# Patient Record
Sex: Female | Born: 1980
Health system: Southern US, Community
[De-identification: ages and names within clinical notes are randomized; demographics above are authoritative.]

## PROBLEM LIST (undated history)

## (undated) DIAGNOSIS — R110 Nausea: Secondary | ICD-10-CM

## (undated) DIAGNOSIS — F410 Panic disorder [episodic paroxysmal anxiety] without agoraphobia: Secondary | ICD-10-CM

## (undated) DIAGNOSIS — K219 Gastro-esophageal reflux disease without esophagitis: Secondary | ICD-10-CM

## (undated) DIAGNOSIS — G8929 Other chronic pain: Secondary | ICD-10-CM

## (undated) DIAGNOSIS — F329 Major depressive disorder, single episode, unspecified: Secondary | ICD-10-CM

## (undated) DIAGNOSIS — F5101 Primary insomnia: Secondary | ICD-10-CM

## (undated) DIAGNOSIS — R002 Palpitations: Secondary | ICD-10-CM

## (undated) DIAGNOSIS — N6452 Nipple discharge: Secondary | ICD-10-CM

## (undated) DIAGNOSIS — M549 Dorsalgia, unspecified: Secondary | ICD-10-CM

## (undated) DIAGNOSIS — N83202 Unspecified ovarian cyst, left side: Secondary | ICD-10-CM

## (undated) DIAGNOSIS — M79674 Pain in right toe(s): Secondary | ICD-10-CM

## (undated) DIAGNOSIS — G43001 Migraine without aura, not intractable, with status migrainosus: Secondary | ICD-10-CM

## (undated) DIAGNOSIS — F32A Depression, unspecified: Secondary | ICD-10-CM

## (undated) DIAGNOSIS — F419 Anxiety disorder, unspecified: Secondary | ICD-10-CM

## (undated) HISTORY — DX: Primary insomnia: F51.01

## (undated) HISTORY — DX: Other chronic pain: G89.29

## (undated) HISTORY — DX: Unspecified ovarian cyst, left side: N83.202

## (undated) HISTORY — DX: Nausea: R11.0

## (undated) HISTORY — DX: Pain in right toe(s): M79.674

## (undated) HISTORY — DX: Gastro-esophageal reflux disease without esophagitis: K21.9

## (undated) HISTORY — DX: Dorsalgia, unspecified: M54.9

## (undated) HISTORY — DX: Anxiety disorder, unspecified: F41.9

## (undated) HISTORY — DX: Palpitations: R00.2

## (undated) HISTORY — DX: Depression, unspecified: F32.A

## (undated) HISTORY — DX: Migraine without aura, not intractable, with status migrainosus: G43.001

## (undated) HISTORY — DX: Nipple discharge: N64.52

## (undated) HISTORY — DX: Major depressive disorder, single episode, unspecified: F32.9

---
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
## 2007-05-02 HISTORY — PX: ABDOMINAL HYSTERECTOMY: SHX81

## 2014-06-26 ENCOUNTER — Emergency Department (HOSPITAL_COMMUNITY)
Admission: EM | Admit: 2014-06-26 | Discharge: 2014-06-26 | Disposition: A | Discharge status: Home / Self Care | Payer: Medicaid Other | Attending: Emergency Medicine | Admitting: Emergency Medicine

## 2014-06-26 ENCOUNTER — Emergency Department (HOSPITAL_COMMUNITY): Payer: Medicaid Other

## 2014-06-26 ENCOUNTER — Encounter (HOSPITAL_COMMUNITY): Payer: Self-pay | Admitting: *Deleted

## 2014-06-26 DIAGNOSIS — R079 Chest pain, unspecified: Secondary | ICD-10-CM

## 2014-06-26 DIAGNOSIS — Z720 Tobacco use: Secondary | ICD-10-CM | POA: Insufficient documentation

## 2014-06-26 DIAGNOSIS — F419 Anxiety disorder, unspecified: Secondary | ICD-10-CM | POA: Insufficient documentation

## 2014-06-26 DIAGNOSIS — R0789 Other chest pain: Secondary | ICD-10-CM | POA: Diagnosis not present

## 2014-06-26 HISTORY — DX: Panic disorder (episodic paroxysmal anxiety): F41.0

## 2014-06-26 LAB — I-STAT CHEM 8, ED
BUN: 15 mg/dL (ref 6–23)
CALCIUM ION: 1.11 mmol/L — AB (ref 1.12–1.23)
CHLORIDE: 108 mmol/L (ref 96–112)
CREATININE: 0.6 mg/dL (ref 0.50–1.10)
GLUCOSE: 88 mg/dL (ref 70–99)
HCT: 46 % (ref 36.0–46.0)
Hemoglobin: 15.6 g/dL — ABNORMAL HIGH (ref 12.0–15.0)
Potassium: 3.9 mmol/L (ref 3.5–5.1)
Sodium: 142 mmol/L (ref 135–145)
TCO2: 20 mmol/L (ref 0–100)

## 2014-06-26 LAB — I-STAT TROPONIN, ED
TROPONIN I, POC: 0 ng/mL (ref 0.00–0.08)
Troponin i, poc: 0 ng/mL (ref 0.00–0.08)

## 2014-06-26 LAB — D-DIMER, QUANTITATIVE: D-Dimer, Quant: 0.34 ug/mL-FEU (ref 0.00–0.48)

## 2014-06-26 MED ORDER — HYDROCODONE-ACETAMINOPHEN 5-325 MG PO TABS: 2.0000 | ORAL_TABLET | ORAL | Status: DC | PRN

## 2014-06-26 MED ORDER — ACETAMINOPHEN 325 MG PO TABS
650.0000 mg | ORAL_TABLET | Freq: Once | ORAL | Status: AC
  Administered 2014-06-26: 650 mg via ORAL
  Filled 2014-06-26: qty 2

## 2014-06-26 NOTE — ED Notes (Addendum)
Pt reports hx of panic attacks. Onset this am while driving of palpitations, cp and anxiety. HR 90 at triage, ekg done. Reports taking heart medication to control her HR, but recently stopped taking it.

## 2014-06-26 NOTE — ED Provider Notes (Signed)
CSN: [id number]     Arrival date & time 06/26/14  7829 History   First MD Initiated Contact with Patient 06/26/14 [id number]     Chief Complaint  Patient presents with  . Anxiety  . Chest Pain      HPI Patient presents with sudden onset of chest pain followed by an anxiety attack rapid heart rate.  Patient's had this before.  She has no history of cardiac disease.  She got a questionable lesion such is being evaluated on her left breast.  She does have a clear discharge from her left nipple.  No fever chills cough.  No swelling in her legs.  Says her heart rate is better but still having chest pain in her chest feels sore to touch. Past Medical History  Diagnosis Date  . Panic attacks    Past Surgical History  Procedure Laterality Date  . Abdominal hysterectomy     History reviewed. No pertinent family history. History  Substance Use Topics  . Smoking status: Current Every Day Smoker    Types: Cigarettes  . Smokeless tobacco: Not on file  . Alcohol Use: No   OB History    No data available     Review of Systems  All other systems reviewed and are negative  Allergies  Aspirin and Ibuprofen  Home Medications   Prior to Admission medications   Medication Sig Start Date End Date Taking? Authorizing Provider  HYDROcodone-acetaminophen (NORCO/VICODIN) 5-325 MG per tablet Take 2 tablets by mouth every 4 (four) hours as needed. 06/26/14   Nelia Shi, MD   BP 105/62 mmHg  Pulse 51  Temp(Src) 98.2 F (36.8 C) (Oral)  Resp 19  Ht  (1.575 m)  Wt 125 lb (56.7 kg)  BMI 22.86 kg/m2  SpO2 99% Physical Exam  Constitutional: She is oriented to person, place, and time. She appears well-developed and well-nourished. No distress.  HENT:  Head: Normocephalic and atraumatic.  Eyes: Pupils are equal, round, and reactive to light.  Neck: Normal range of motion.  Cardiovascular: Normal rate and intact distal pulses.   Pulmonary/Chest: No respiratory distress.    Pain  reproducible to palpation were indicated  Abdominal: Normal appearance. She exhibits no distension.  Musculoskeletal: Normal range of motion.  Neurological: She is alert and oriented to person, place, and time. No cranial nerve deficit.  Skin: Skin is warm and dry. No rash noted.  Psychiatric: She has a normal mood and affect. Her behavior is normal.  Nursing note and vitals reviewed.   ED Course  Procedures (including critical care time) Labs Review Labs Reviewed  I-STAT CHEM 8, ED - Abnormal; Notable for the following:    Calcium, Ion 1.11 (*)    Hemoglobin 15.6 (*)    All other components within normal limits  D-DIMER, QUANTITATIVE  I-STAT TROPOININ, ED  Rosezena Sensor, ED    Imaging Review Dg Chest 2 View  06/26/2014   CLINICAL DATA:  34 year old female new with 6 month history of left chest pain acutely worse today accompanied by sensation of heart racing.  EXAM: CHEST  2 VIEW  COMPARISON:  None.  FINDINGS: The lungs are clear and negative for focal airspace consolidation, pulmonary edema or suspicious pulmonary nodule. No pleural effusion or pneumothorax. Cardiac and mediastinal contours are within normal limits. No acute fracture or lytic or blastic osseous lesions. The visualized upper abdominal bowel gas pattern is unremarkable. Surgical clips in the right upper quadrant suggest prior cholecystectomy.  IMPRESSION: Normal chest  x-ray.   Electronically Signed   By: Malachy MoanHeath  McCullough M.D.   On: 06/26/2014 10:07     EKG Interpretation   Date/Time:  Friday June 26 2014 08:58:55 EST Ventricular Rate:  72 PR Interval:  134 QRS Duration: 88 QT Interval:  370 QTC Calculation: 405 R Axis:   62 Text Interpretation:  Normal sinus rhythm Normal ECG Confirmed by Eviana Sibilia   MD, Pryce Folts ([id number]) on 06/26/2014 9:27:55 AM       Delta troponins are negative.  Exam the story more consistent with chest wall pain.  Encouraged her to follow-up for evaluation of breast tenderness.   Patient probably could benefit from early outpatient Holter monitor.  Will follow with cardiology. MDM   Final diagnoses:  Chest pain  Chest wall pain        Nelia Shiobert L Karma Ansley, MD 06/26/14 1349

## 2014-06-26 NOTE — ED Notes (Signed)
Pharmacy tech at bedside 

## 2014-06-26 NOTE — Discharge Instructions (Signed)

## 2014-07-23 ENCOUNTER — Ambulatory Visit: Payer: Medicaid Other | Admitting: Cardiology

## 2014-08-26 ENCOUNTER — Ambulatory Visit: Payer: Medicaid Other | Admitting: Cardiology

## 2016-02-21 IMAGING — DX DG CHEST 2V
2 series · 2 of 2 positions shown · non-contrast
Comparison: None.

CLINICAL DATA: 33-year-old female new with 6 month history of left
chest pain acutely worse today accompanied by sensation of heart
racing.

EXAM:
CHEST  2 VIEW

[chest pa]
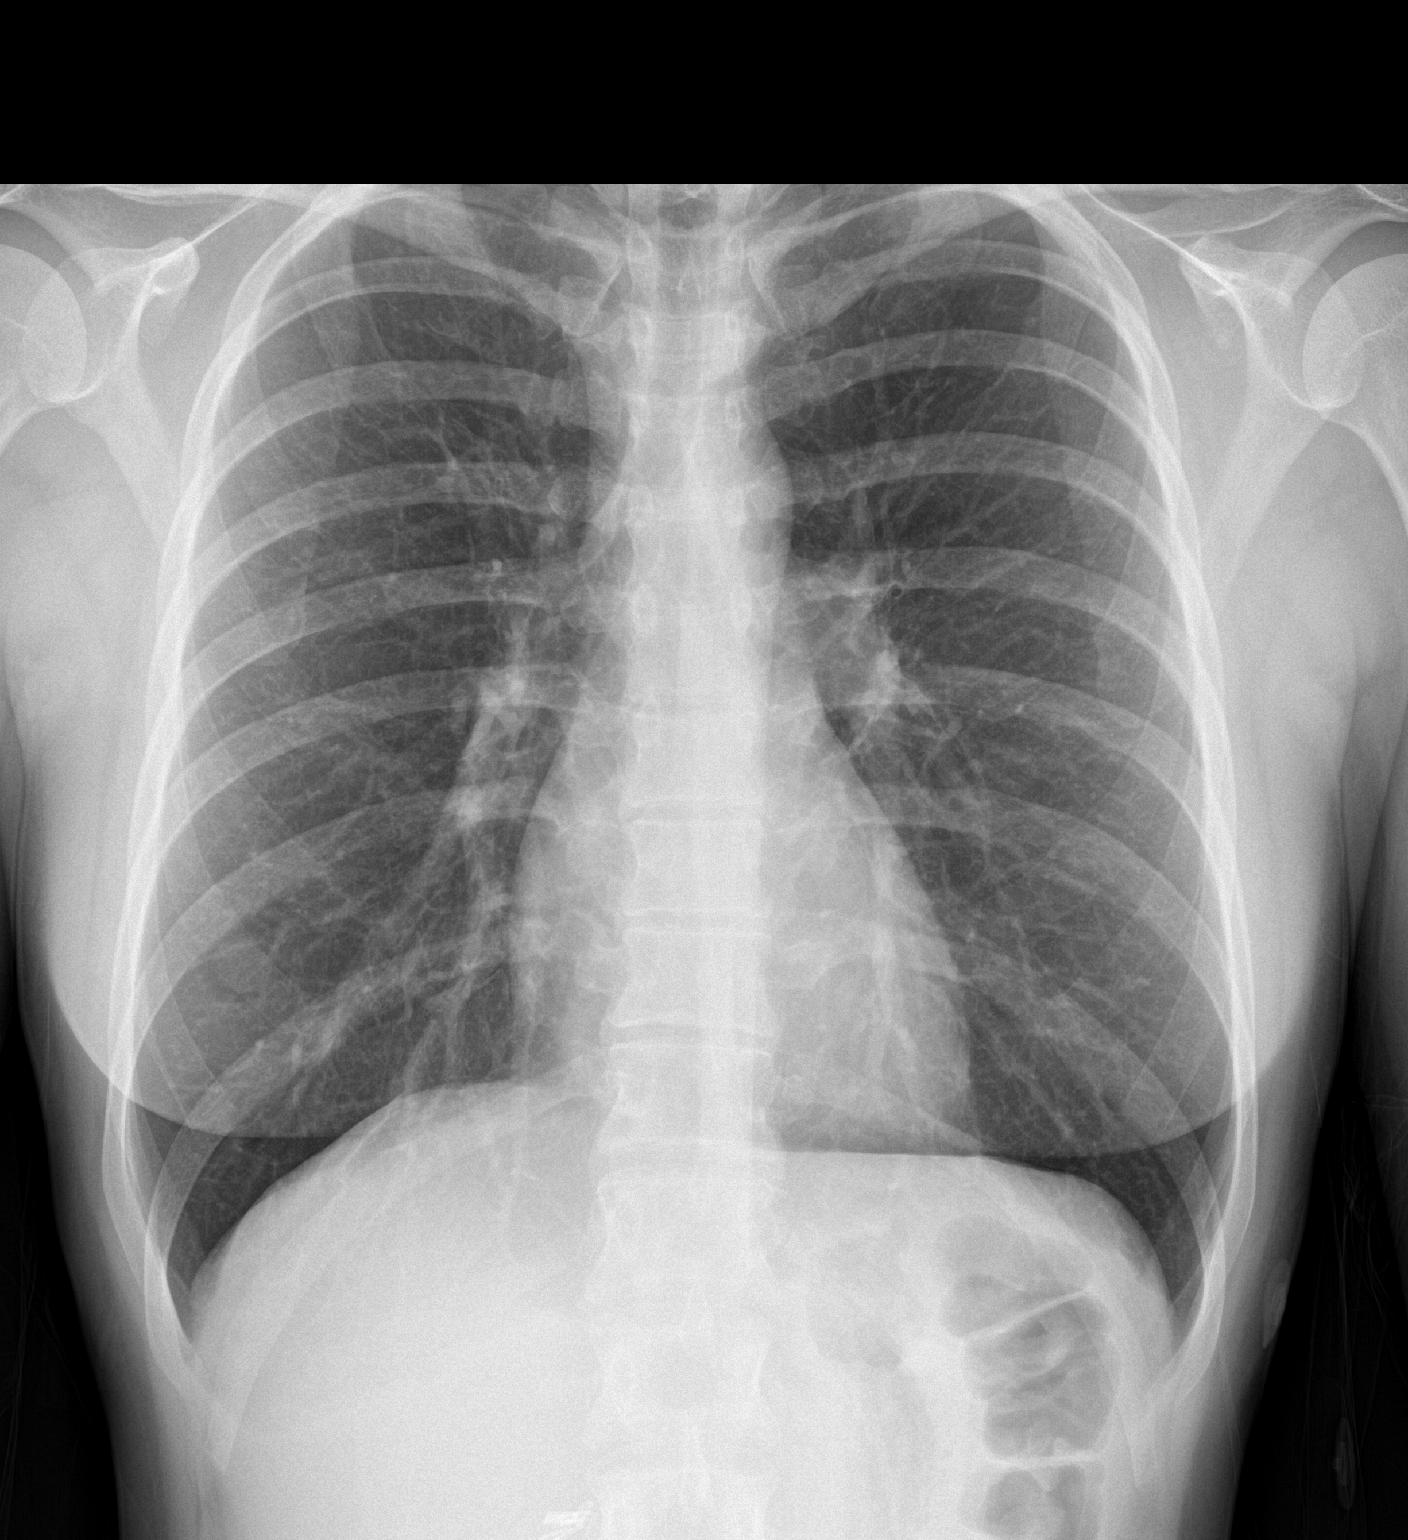

[chest lat]
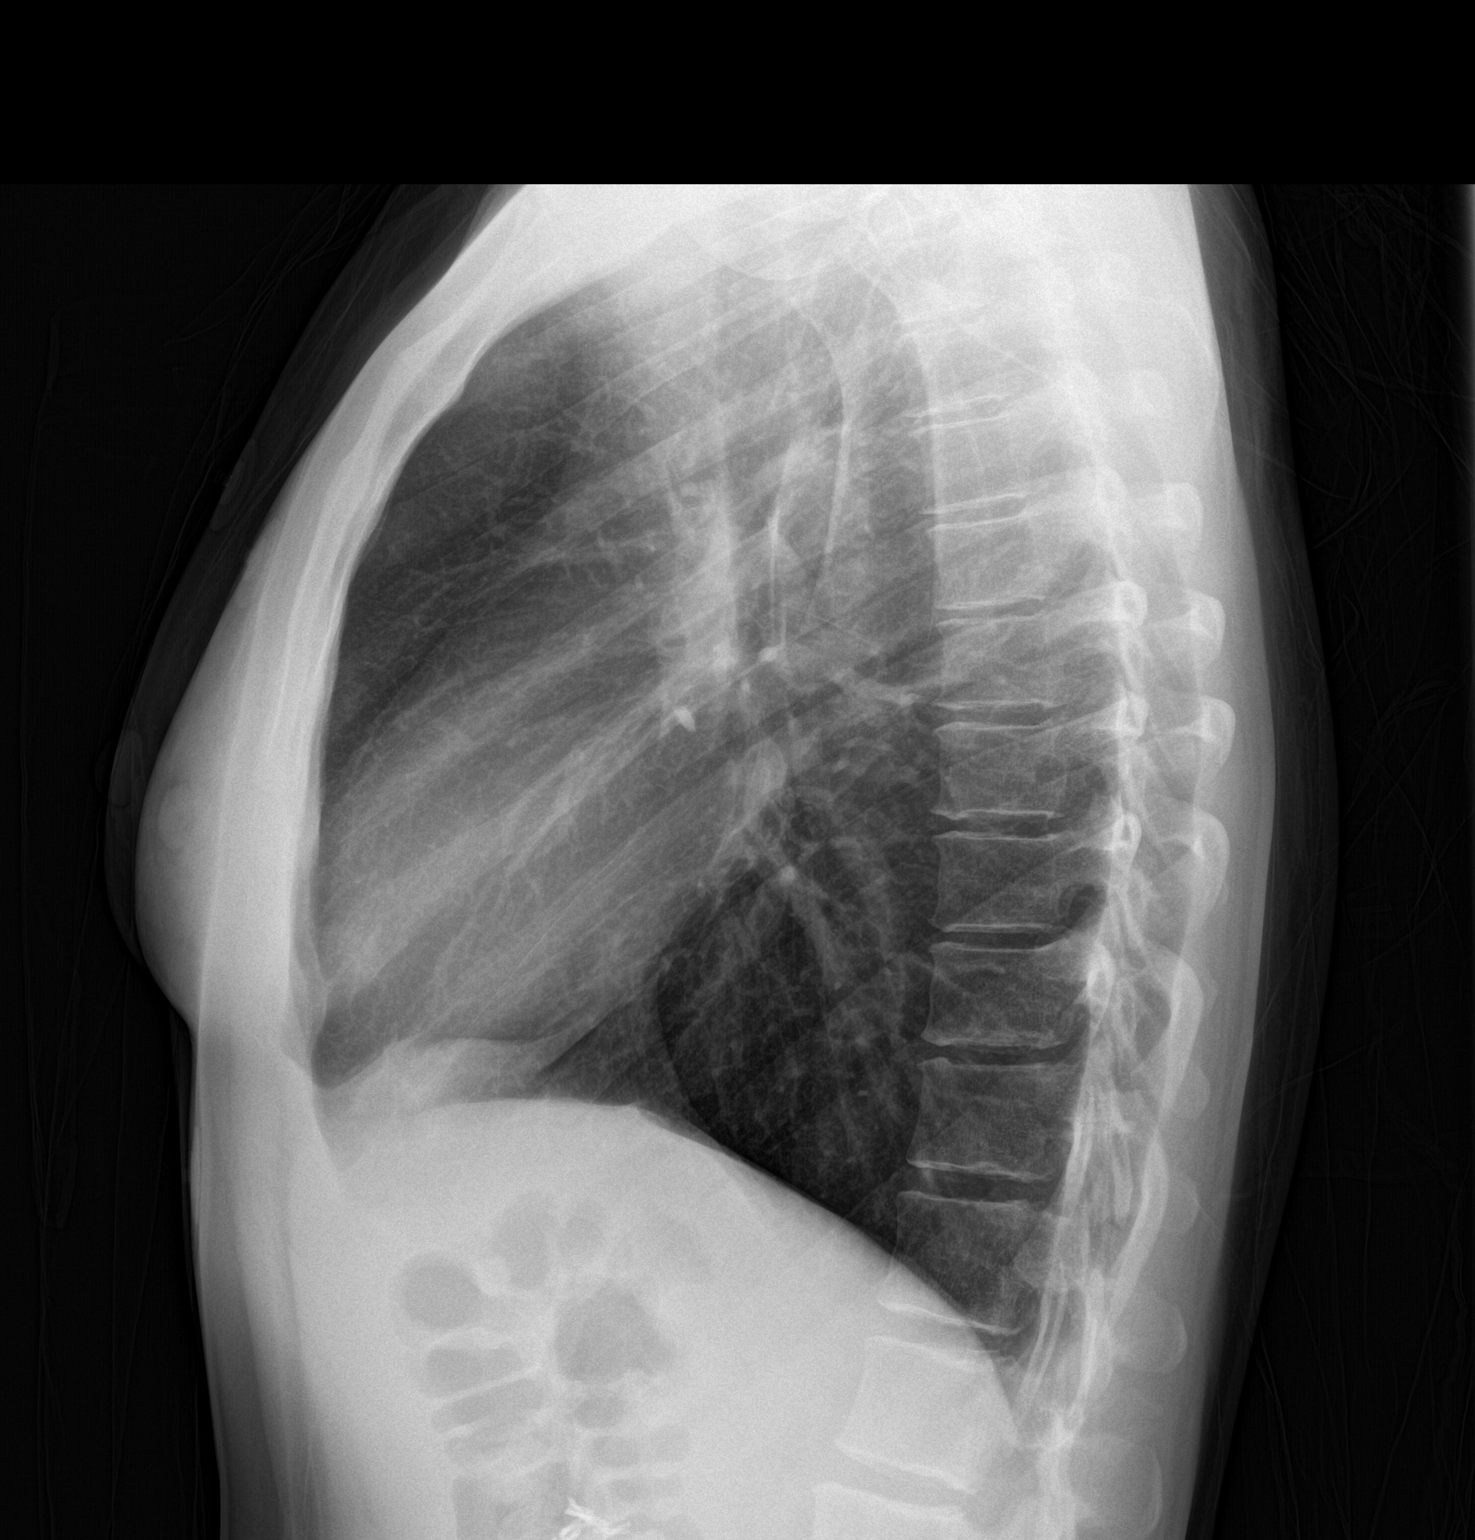

[2 of 2 positions shown; findings below may reference images not displayed]

FINDINGS: The lungs are clear and negative for focal airspace consolidation,
pulmonary edema or suspicious pulmonary nodule. No pleural effusion
or pneumothorax. Cardiac and mediastinal contours are within normal
limits. No acute fracture or lytic or blastic osseous lesions. The
visualized upper abdominal bowel gas pattern is unremarkable.
Surgical clips in the right upper quadrant suggest prior
cholecystectomy.
IMPRESSION: Normal chest x-ray.

## 2017-05-10 ENCOUNTER — Encounter (HOSPITAL_COMMUNITY): Payer: Self-pay | Admitting: *Deleted

## 2017-05-10 ENCOUNTER — Emergency Department (HOSPITAL_COMMUNITY): Payer: BLUE CROSS/BLUE SHIELD

## 2017-05-10 ENCOUNTER — Emergency Department (HOSPITAL_COMMUNITY)
Admission: EM | Admit: 2017-05-10 | Discharge: 2017-05-10 | Disposition: A | Discharge status: Home / Self Care | Payer: BLUE CROSS/BLUE SHIELD | Attending: Emergency Medicine | Admitting: Emergency Medicine

## 2017-05-10 DIAGNOSIS — Z5321 Procedure and treatment not carried out due to patient leaving prior to being seen by health care provider: Secondary | ICD-10-CM | POA: Diagnosis not present

## 2017-05-10 DIAGNOSIS — R079 Chest pain, unspecified: Secondary | ICD-10-CM | POA: Insufficient documentation

## 2017-05-10 LAB — CBC
HCT: 43.8 % (ref 36.0–46.0)
Hemoglobin: 14.3 g/dL (ref 12.0–15.0)
MCH: 31.6 pg (ref 26.0–34.0)
MCHC: 32.6 g/dL (ref 30.0–36.0)
MCV: 96.9 fL (ref 78.0–100.0)
Platelets: 368 10*3/uL (ref 150–400)
RBC: 4.52 MIL/uL (ref 3.87–5.11)
RDW: 12.7 % (ref 11.5–15.5)
WBC: 16.2 10*3/uL — AB (ref 4.0–10.5)

## 2017-05-10 LAB — BASIC METABOLIC PANEL
Anion gap: 6 (ref 5–15)
BUN: 11 mg/dL (ref 6–20)
CALCIUM: 9.3 mg/dL (ref 8.9–10.3)
CO2: 25 mmol/L (ref 22–32)
CREATININE: 0.61 mg/dL (ref 0.44–1.00)
Chloride: 104 mmol/L (ref 101–111)
GFR calc non Af Amer: >60 mL/min (ref >60)
Glucose, Bld: 97 mg/dL (ref 65–99)
Potassium: 4.1 mmol/L (ref 3.5–5.1)
SODIUM: 135 mmol/L (ref 135–145)

## 2017-05-10 LAB — I-STAT TROPONIN, ED: TROPONIN I, POC: 0 ng/mL (ref 0.00–0.08)

## 2017-05-10 LAB — I-STAT BETA HCG BLOOD, ED (MC, WL, AP ONLY): HCG, QUANTITATIVE: 8.5 m[IU]/mL — AB (ref <5)

## 2017-05-10 NOTE — ED Triage Notes (Signed)
To ED for eval of migraine for the past 3 weeks - no hx of same. Seen by pcp yesterday due to vomiting and a sharp pain in head - told this was a migraine. Woke this am with HA and cp. Pt complains of SOB - taking deep breaths as if she can't get a full breath. Vomiting yesterday but none today. Photophobia. No speech deficits noted.

## 2017-06-01 ENCOUNTER — Encounter: Payer: Self-pay | Admitting: Neurology

## 2017-06-01 ENCOUNTER — Encounter: Payer: Self-pay | Admitting: *Deleted

## 2017-06-01 ENCOUNTER — Ambulatory Visit (INDEPENDENT_AMBULATORY_CARE_PROVIDER_SITE_OTHER): Payer: BLUE CROSS/BLUE SHIELD | Admitting: Neurology

## 2017-06-01 VITALS — BP 118/75 | HR 74 | Ht 62.0 in | Wt 133.0 lb

## 2017-06-01 DIAGNOSIS — H5461 Unqualified visual loss, right eye, normal vision left eye: Secondary | ICD-10-CM

## 2017-06-01 DIAGNOSIS — R51 Headache with orthostatic component, not elsewhere classified: Secondary | ICD-10-CM

## 2017-06-01 DIAGNOSIS — R29810 Facial weakness: Secondary | ICD-10-CM | POA: Diagnosis not present

## 2017-06-01 DIAGNOSIS — R519 Headache, unspecified: Secondary | ICD-10-CM

## 2017-06-01 NOTE — Patient Instructions (Signed)
Occipital Neuralgia Occipital neuralgia is a type of headache that causes episodes of very bad pain in the back of your head. Pain from occipital neuralgia may spread (radiate) to other parts of your head. The pain is usually brief and often goes away after you rest and relax. These headaches may be caused by irritation of the nerves that leave your spinal cord high up in your neck, just below the base of your skull (occipital nerves). Your occipital nerves transmit sensations from the back of your head, the top of your head, and the areas behind your ears. What are the causes? Occipital neuralgia can occur without any known cause (primary headache syndrome). In other cases, occipital neuralgia is caused by pressure on or irritation of one of the two occipital nerves. Causes of occipital nerve compression or irritation include:  Wear and tear of the vertebrae in the neck (osteoarthritis).  Neck injury.  Disease of the disks that separate the vertebrae.  Tumors.  Gout.  Infections.  Diabetes.  Swollen blood vessels that put pressure on the occipital nerves.  Muscle spasm in the neck.  What are the signs or symptoms? Pain is the main symptom of occipital neuralgia. It usually starts in the back of the head but may also be felt in other areas supplied by the occipital nerves. Pain is usually on one side but may be on both sides. You may have:  Brief episodes of very bad pain that is burning, stabbing, shocking, or shooting.  Pain behind the eye.  Pain triggered by neck movement or hair brushing.  Scalp tenderness.  Aching in the back of the head between episodes of very bad pain.  How is this diagnosed? Your health care provider may diagnose occipital neuralgia based on your symptoms and a physical exam. During the exam, the health care provider may push on areas supplied by the occipital nerves to see if they are painful. Some tests may also be done to help in making the  diagnosis. These may include:  Imaging studies of the upper spinal cord, such as an MRI or CT scan. These may show compression or spinal cord abnormalities.  Nerve block. You will get an injection of numbing medicine (local anesthetic) near the occipital nerve to see if this relieves pain.  How is this treated? Treatment may begin with simple measures, such as:  Rest.  Massage.  Heat.  Over-the-counter pain relievers.  If these measures do not work, you may need other treatments, including:  Medicines such as: ? Prescription-strength anti-inflammatory medicines. ? Muscle relaxants. ? Antiseizure medicines. ? Antidepressants.  Steroid injection. This involves injections of local anesthetic and strong anti-inflammatory drugs (steroids).  Pulsed radiofrequency. Wires are implanted to deliver electrical impulses that block pain signals from the occipital nerve.  Physical therapy.  Surgery to relieve nerve pressure.  Follow these instructions at home:  Take all medicines as directed by your health care provider.  Avoid activities that cause pain.  Rest when you have an attack of pain.  Try gentle massage or a heating pad to relieve pain.  Work with a physical therapist to learn stretching exercises you can do at home.  Try a different pillow or sleeping position.  Practice good posture.  Try to stay active. Get regular exercise that does not cause pain. Ask your health care provider to suggest safe exercises for you.  Keep all follow-up visits as directed by your health care provider. This is important. Contact a health care provider if:    Your medicine is not working.  You have new or worsening symptoms. Get help right away if:  You have very bad head pain that is not going away.  You have a sudden change in vision, balance, or speech. This information is not intended to replace advice given to you by your health care provider. Make sure you discuss any  questions you have with your health care provider. Document Released: 04/11/2001 Document Revised: 09/23/2015 Document Reviewed: 04/09/2013 Elsevier Interactive Patient Education  2017 Elsevier Inc.      Migraine Headache A migraine headache is an intense, throbbing pain on one side or both sides of the head. Migraines may also cause other symptoms, such as nausea, vomiting, and sensitivity to light and noise. What are the causes? Doing or taking certain things may also trigger migraines, such as:  Alcohol.  Smoking.  Medicines, such as: ? Medicine used to treat chest pain (nitroglycerine). ? Birth control pills. ? Estrogen pills. ? Certain blood pressure medicines.  Aged cheeses, chocolate, or caffeine.  Foods or drinks that contain nitrates, glutamate, aspartame, or tyramine.  Physical activity.  Other things that may trigger a migraine include:  Menstruation.  Pregnancy.  Hunger.  Stress, lack of sleep, too much sleep, or fatigue.  Weather changes.  What increases the risk? The following factors may make you more likely to experience migraine headaches:  Age. Risk increases with age.  Family history of migraine headaches.  Being Caucasian.  Depression and anxiety.  Obesity.  Being a woman.  Having a hole in the heart (patent foramen ovale) or other heart problems.  What are the signs or symptoms? The main symptom of this condition is pulsating or throbbing pain. Pain may:  Happen in any area of the head, such as on one side or both sides.  Interfere with daily activities.  Get worse with physical activity.  Get worse with exposure to bright lights or loud noises.  Other symptoms may include:  Nausea.  Vomiting.  Dizziness.  General sensitivity to bright lights, loud noises, or smells.  Before you get a migraine, you may get warning signs that a migraine is developing (aura). An aura may include:  Seeing flashing lights or having  blind spots.  Seeing bright spots, halos, or zigzag lines.  Having tunnel vision or blurred vision.  Having numbness or a tingling feeling.  Having trouble talking.  Having muscle weakness.  How is this diagnosed? A migraine headache can be diagnosed based on:  Your symptoms.  A physical exam.  Tests, such as CT scan or MRI of the head. These imaging tests can help rule out other causes of headaches.  Taking fluid from the spine (lumbar puncture) and analyzing it (cerebrospinal fluid analysis, or CSF analysis).  How is this treated? A migraine headache is usually treated with medicines that:  Relieve pain.  Relieve nausea.  Prevent migraines from coming back.  Treatment may also include:  Acupuncture.  Lifestyle changes like avoiding foods that trigger migraines.  Follow these instructions at home: Medicines  Take over-the-counter and prescription medicines only as told by your health care provider.  Do not drive or use heavy machinery while taking prescription pain medicine.  To prevent or treat constipation while you are taking prescription pain medicine, your health care provider may recommend that you: ? Drink enough fluid to keep your urine clear or pale yellow. ? Take over-the-counter or prescription medicines. ? Eat foods that are high in fiber, such as fresh fruits and  and beans. ? Limit foods that are high in fat and processed sugars, such as fried and sweet foods. Lifestyle  Avoid alcohol use.  Do not use any products that contain nicotine or tobacco, such as cigarettes and e-cigarettes. If you need help quitting, ask your health care provider.  Get at least 8 hours of sleep every night.  Limit your stress. General instructions   Keep a journal to find out what may trigger your migraine headaches. For example, write down: ? What you eat and drink. ? How much sleep you get. ? Any change to your diet or medicines.  If you  have a migraine: ? Avoid things that make your symptoms worse, such as bright lights. ? It may help to lie down in a dark, quiet room. ? Do not drive or use heavy machinery. ? Ask your health care provider what activities are safe for you while you are experiencing symptoms.  Keep all follow-up visits as told by your health care provider. This is important. Contact a health care provider if:  You develop symptoms that are different or more severe than your usual migraine symptoms. Get help right away if:  Your migraine becomes severe.  You have a fever.  You have a stiff neck.  You have vision loss.  Your muscles feel weak or like you cannot control them.  You start to lose your balance often.  You develop trouble walking.  You faint. This information is not intended to replace advice given to you by your health care provider. Make sure you discuss any questions you have with your health care provider. Document Released: 04/17/2005 Document Revised: 11/05/2015 Document Reviewed: 10/04/2015 Elsevier Interactive Patient Education  2017 Elsevier Inc.   

## 2017-06-01 NOTE — Progress Notes (Signed)
[facility]    Provider:  Dr Brooke May Referring Provider: Simone May, Keung, MD Primary Care Physician:  Brooke May, Keung, MD  CC:  Headaches  HPI:  Brooke PalmerBonnie May is a 37 y.o. female here as a referral from Dr. Nedra May for headaches. She has a past medical history of depression, anxiety, back pain, palpitations, ovarian cyst, GERD and chronic nausea, insomnia, migraine without aura.  She is a current every day smoker smokes 1-1/2 packs of cigarettes per day and has occasional alcohol use. Started at the end of November 2018. After they moved she felt a zap, an electric jolt on the left side. It hot so hard, she lost all vision, it was severe, brief, (points to the occipital area on the left), for 3 days it happened off and on. She did quite a bit of lifting the day it started. Ever since then she has migraines, she had to go to the emergency room for one, she lost vision in her right eye, the right side of her face went numb, she had facial droop. She never had headaches beforehand. No family history of migraines. Somedays it feels like stabbing, can be on the right or left, feels like burning like her head is on fire, she has severe light sensitivity, vomiting, she has lost vision in the right eye, nausea, severe pain. She has these every day. This week she had a shot of Kenalog and it takes the edge off. A week of prednisone helped. Imitrex takes the edge off too. But there is still a dull pain.  She had a prednisone taper and kenalog shots. No other focal neurologic deficits, associated symptoms, inciting events or modifiable factors.  Reviewed notes, labs and imaging from outside physicians, which showed:  BMP normal 05/10/2017  Patient is here for migraines.  Reviewed primary care notes referring physician notes.  Symptoms include headache and nausea.  Pain is located in the right frontal area and right temporal area.  The pain is sharp.  Sudden onset months ago.  Symptoms are exacerbated by nose  and light,.  She is a current smoker 1-1/2 packs a day.  She is on Ultram, Toprol, Mobic, Celexa.  Exam was normal.  Review of Systems: Patient complains of symptoms per HPI as well as the following symptoms: Insomnia, sleepiness, memory loss, confusion, headache, numbness, anxiety, not enough sleep, change in appetite, disinterest in activities, joint pain, aching muscles, constipation, blurred vision, eye pain. Pertinent negatives and positives per HPI. All others negative.   Social History   Socioeconomic History  . Marital status: Legally Separated    Spouse name: Not on file  . Number of children: 2  . Years of education: 4 years college  . Highest education level: Associate degree: occupational, Scientist, product/process developmenttechnical, or vocational program  Social Needs  . Financial resource strain: Not on file  . Food insecurity - worry: Not on file  . Food insecurity - inability: Not on file  . Transportation needs - medical: Not on file  . Transportation needs - non-medical: Not on file  Occupational History  . Not on file  Tobacco Use  . Smoking status: Current Every Day Smoker    Packs/day: 1.50    Types: Cigarettes  . Smokeless tobacco: Never Used  Substance and Sexual Activity  . Alcohol use: Yes    Comment: occasional  . Drug use: No  . Sexual activity: Not on file  Other Topics Concern  . Not on file  Social History Narrative  Lives at home with her children & her mother moved in with her (pt is caretaker)   Right handed   Drinks caffeine all day long    Family History  Problem Relation Age of Onset  . Diabetes Other   . High Cholesterol Other   . Hypertension Other   . Breast cancer Mother   . Fibromyalgia Mother   . Stroke Mother   . Lung cancer Father   . Ovarian cancer Maternal Grandmother   . Congestive Heart Failure Paternal Grandmother     Past Medical History:  Diagnosis Date  . Anxiety   . Chronic back pain   . Chronic nausea   . Chronic toe pain, right foot   .  Depression   . GERD (gastroesophageal reflux disease)   . Insomnia, idiopathic   . Left ovarian cyst   . Migraine without aura and with status migrainosus, not intractable   . Nipple discharge   . Palpitation   . Panic attacks     Past Surgical History:  Procedure Laterality Date  . ABDOMINAL HYSTERECTOMY  2009  . RENAL STENT  1987    Current Outpatient Medications  Medication Sig Dispense Refill  . amitriptyline (ELAVIL) 50 MG tablet Take 50 mg by mouth at bedtime.  5  . metoprolol succinate (TOPROL-XL) 25 MG 24 hr tablet Take 25 mg by mouth daily.    . SUMAtriptan (IMITREX) 100 MG tablet Take 1 tablet by mouth every 12 (twelve) hours as needed.  5  . traMADol (ULTRAM) 50 MG tablet Take 50 mg by mouth 3 (three) times daily as needed.     No current facility-administered medications for this visit.     Allergies as of 06/01/2017 - Review Complete 06/01/2017  Allergen Reaction Noted  . Aspirin Hives 06/26/2014  . Ibuprofen Hives 06/26/2014  . Morphine and related  06/01/2017  . Tylenol [acetaminophen] Nausea And Vomiting 06/01/2017    Vitals: BP 118/75 (BP Location: Right Arm, Patient Position: Sitting)   Pulse 74   Ht 5\' 2"  (1.575 m)   Wt 133 lb (60.3 kg)   BMI 24.33 kg/m  Last Weight:  Wt Readings from Last 1 Encounters:  06/01/17 133 lb (60.3 kg)   Last Height:   Ht Readings from Last 1 Encounters:  06/01/17 5\' 2"  (1.575 m)         Assessment/Plan:  37 year old with new onset migrainous headaches.   MRI brain w/wo contrast due to concerning symptom of positional occipital headache, facial droop, vision loss of right eye to evaluate for space-occupying masses, strokes, lesions, tumors, chiari or any other intracranial etiology.    Orders Placed This Encounter  Procedures  . MR BRAIN W WO CONTRAST   Performed by Dr. Lucia Gaskins M.D. All procedures a documented blood were medically necessary, reasonable and appropriate based on the patient's history, medical  diagnosis and physician opinion. Verbal informed consent was obtained from the patient, patient was informed of potential risk of procedure, including bruising, bleeding, hematoma formation, infection, muscle weakness, muscle pain, numbness, transient hypertension, transient hyperglycemia and transient insomnia among others. All areas injected were topically clean with isopropyl rubbing alcohol. Nonsterile nonlatex gloves were worn during the procedure.  1. Greater occipital nerve block [phone]). The greater occipital nerve site was identified at the nuchal line medial to the occipital artery. Medication was injected into the left and right occipital nerve areas and suboccipital areas. Patient's condition is associated with inflammation of the greater occipital nerve and  associated multiple groups. Injection was deemed medically necessary, reasonable and appropriate. Injection represents a separate and unique surgical service.  2. Lesser occipital nerve block [phone]). The lesser occipital nerve site was identified approximately 2 cm lateral to the greater occipital nerve. Occasion was injected into the left and right occipital nerve areas. Patient's condition is associated with inflammation of the lesser occipital nerve and associated muscle groups. Injection was deemed medically necessary, reasonable and appropriate. Injection represents a separate and unique surgical service.   3. Auriculotemporal nerve block (60454): The Auriculotemporal nerve site was identified along the posterior margin of the sternocleidomastoid muscle toward the base of the ear. Medication was injected into the left and right radicular temporal nerve areas. Patient's condition is associated with inflammation of the Auriculotemporal Nerve and associated muscle groups. Injection was deemed medically necessary, reasonable and appropriate. Injection represents a separate and unique surgical service.  4. Supraorbital nerve block (64400):  Supraorbital nerve site was identified along the incision of the frontal bone on the orbital/supraorbital ridge. Medication was injected into the left and right supraorbital nerve areas. Patient's condition is associated with inflammation of the supraorbital and associated muscle groups. Injection was deemed medically necessary, reasonable and appropriate. Injection represents a separate and unique surgical service.  Cc: Dr. Phillips Grout, MD  [facility] [address] [place], [place] [address]  Phone [phone] Fax [phone]

## 2017-06-01 NOTE — Progress Notes (Signed)
Nerve block w/o steroid   Administered 5 syringes (3 mL each) of 1/2 @ 1/2 Bupivocaine & Lidocaine)  Bupivocaine 0.5 % 7.5 mL total LOT: [id number] EXP: 08/30/2018 NDC: [id number]  Lidocaine 2% 7.5 mL total LOT: [id number] EXP: 07/22 NDC: [id number] //BCrn

## 2017-06-15 ENCOUNTER — Telehealth: Payer: Self-pay | Admitting: Neurology

## 2017-06-15 NOTE — Telephone Encounter (Addendum)
Spoke with pt. She stated she has a migraine, was much worse yesterday, required ED visit and migraine cocktail. She asked for injections. RN informed her that Dr. Lucia GaskinsAhern was unable to do injections today but has offered an IV infusion of Toradol & Depakon. She will be able to return to work. Patient verbalized understanding and will come now.   She also stated that Sumatriptan made her feel foggy headed and chest tightness (elephant on chest) when she takes it. She has asked for an alternative medication.  Per Dr. Lucia GaskinsAhern, pt can be given Toradol 30 mg IV x 1 and Depacon 1 gram IV x 1. Order form completed signed and given to Inetta Fermoina in infusion with copy of insurance card.

## 2017-06-15 NOTE — Telephone Encounter (Signed)
Pt called she woke up yesterday with a severe migraine, so intense she lost feeling in her legs. Pt went to PCP, he could not help her and advised [facility] to [facility] which she did. Pt still has HA today and is wanting to be seen today for injections. Please call to advise. She is aware the clinic closes at noon

## 2017-06-17 ENCOUNTER — Ambulatory Visit
Admission: RE | Admit: 2017-06-17 | Discharge: 2017-06-17 | Disposition: A | Discharge status: Home / Self Care | Payer: BLUE CROSS/BLUE SHIELD | Source: Ambulatory Visit | Attending: Neurology | Admitting: Neurology

## 2017-06-17 DIAGNOSIS — R51 Headache with orthostatic component, not elsewhere classified: Secondary | ICD-10-CM

## 2017-06-17 DIAGNOSIS — H5461 Unqualified visual loss, right eye, normal vision left eye: Secondary | ICD-10-CM

## 2017-06-17 DIAGNOSIS — R29810 Facial weakness: Secondary | ICD-10-CM | POA: Diagnosis not present

## 2017-06-17 DIAGNOSIS — G8929 Other chronic pain: Secondary | ICD-10-CM

## 2017-06-17 DIAGNOSIS — R519 Headache, unspecified: Secondary | ICD-10-CM

## 2017-06-17 MED ORDER — GADOBENATE DIMEGLUMINE 529 MG/ML IV SOLN
12.0000 mL | Freq: Once | INTRAVENOUS | Status: AC | PRN
  Administered 2017-06-17: 12 mL via INTRAVENOUS

## 2017-06-18 ENCOUNTER — Encounter: Payer: Self-pay | Admitting: Neurology
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
## 2017-06-20 NOTE — Telephone Encounter (Signed)
Spoke with patient. She stated that she has not been taking the Amitriptyline for 2 weeks. She had the feeling of chest tightness/elephant on chest and was not sure if it was the sumatriptan or the amitriptyline so she tried both. She stated that she has not picked up the new triptan yet due to insurance/cost. She has seen her PCP yesterday and he stated he could prescribe a stimulant but would want to check with Neurology first as they can cause headaches. She reported she has daily headaches. Her thought process is all over the place, she reports. She has been diagnosed with idiopathic insomnia in the past. She tried Ambien in the past but felt "drugged" and quit taking it. Sleepiness has been there since migraines have started but she has been able to "fight it." She reports maybe 6 hours of sleep night, she tosses and turns. She is concerned about the sudden difficulty fighting the sleepiness in the last week. Reviewed MRI with patient, unremarkable. Patient would like to know what Dr. Lucia GaskinsAhern thinks about the sleepiness and wants to know where do we go from here.

## 2017-06-21 NOTE — Telephone Encounter (Signed)
Pt returning call, offered appt time of 10:15 on 2/25 pt declined due to work but accepted 3/13 at 1:45 with NP Eber Jonesarolyn

## 2017-06-21 NOTE — Telephone Encounter (Signed)
Called patient and LVM asking for call back to make an appt with Aundra MilletMegan or Eber Jonesarolyn for evaluation to see if she needs further testing for sleep. Left office number for call back.  Looks like as of now, Eber JonesCarolyn has the soonest available appt, next week. Was going to offer patient that one.

## 2017-06-21 NOTE — Telephone Encounter (Signed)
Noted  

## 2017-06-21 NOTE — Telephone Encounter (Signed)
Pt called back requesting the Monday appt instead pt has been rescheduled for Monday 2/25 at 10:15 with NP Eber Jonesarolyn

## 2017-06-21 NOTE — Telephone Encounter (Signed)
If she stopped the Amitriptyline then the sleepiness is not due to the medication.  Please have her see Aundra MilletMegan or Eber JonesCarolyn to evaluate her and see if we need a sleep study.
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
## 2017-06-25 ENCOUNTER — Encounter: Payer: Self-pay | Admitting: Nurse Practitioner

## 2017-06-25 ENCOUNTER — Ambulatory Visit (INDEPENDENT_AMBULATORY_CARE_PROVIDER_SITE_OTHER): Payer: BLUE CROSS/BLUE SHIELD | Admitting: Nurse Practitioner

## 2017-06-25 VITALS — BP 118/78 | HR 75 | Ht 62.0 in | Wt 136.4 lb

## 2017-06-25 DIAGNOSIS — R400 Somnolence: Secondary | ICD-10-CM | POA: Diagnosis not present

## 2017-06-25 DIAGNOSIS — G43009 Migraine without aura, not intractable, without status migrainosus: Secondary | ICD-10-CM | POA: Diagnosis not present

## 2017-06-25 NOTE — Patient Instructions (Signed)
Given list of migraine triggers Will order sleep study for morning headaches, daytime drowsiness, insomnia Continue Toprol at current dose MRI of the brain was normal without acute changes I explained in particular the risks and ramifications of untreated moderate to severe OSA, especially with respect to cardiovascular disease  including congestive heart failure, difficult to treat hypertension, cardiac arrhythmias, or stroke. Even type 2 diabetes has, in part, been linked to untreated OSA. Symptoms of untreated OSA include daytime sleepiness, memory problems, mood irritability and mood disorder such as depression and anxiety, lack of energy, as well as recurrent headaches, especially morning headaches.  patient to eat healthy, exercise daily and keep well hydrated, to keep a scheduled bedtime and wake time routine, to not skip any meals and eat healthy snacks in between meals

## 2017-07-11 ENCOUNTER — Ambulatory Visit: Payer: BLUE CROSS/BLUE SHIELD | Admitting: Nurse Practitioner

## 2017-08-14 NOTE — Progress Notes (Signed)
Personally  participated in, made any corrections needed, and agree with history, physical, neuro exam,assessment and plan as stated above.    Adelae Yodice, MD [facility] 

## 2017-08-21 ENCOUNTER — Telehealth: Payer: Self-pay | Admitting: *Deleted

## 2017-08-21 NOTE — Telephone Encounter (Signed)
Patient had an appt for a sleep consult she had to cancel.  Lost her job and insurance due to missing work for doctor appointments.  Cant afford her meds at this time.  Wonders if she can get samples or what can she do in order to get her meds.  Continues to have headaches. Please call

## 2017-08-21 NOTE — Telephone Encounter (Signed)
Spoke with patient. Offered her a few options for potentially more affordable medications and healthcare. RN advised pt to check goodrx.com as there are coupons. For example, Amitriptyline 50 mg looks like it is about around $12-14 depending on which pharmacy is used. Pt was very appreciative as she has not been taking this medication and thus headaches are coming back. Pt also was directed to the [place] financial assistance program's website where she can research information and apply. Unsure if pt qualifies but it is worth a try. Lastly, pt was made aware that if she were able to visit the office again she would need to bring a cash payment of $250. Pt verbalized understanding.

## 2017-08-22 ENCOUNTER — Institutional Professional Consult (permissible substitution): Payer: BLUE CROSS/BLUE SHIELD | Admitting: Neurology

## 2017-08-23 NOTE — Telephone Encounter (Signed)
Other than [place] I dont have any other resources. I tried to call the patient but I could not leave her a message and her voice mail was full .

## 2017-11-13 ENCOUNTER — Encounter: Payer: Self-pay | Admitting: Gastroenterology

## 2024-06-05 ENCOUNTER — Ambulatory Visit (INDEPENDENT_AMBULATORY_CARE_PROVIDER_SITE_OTHER)
Admission: RE | Admit: 2024-06-05 | Discharge: 2024-06-05 | Disposition: A | Source: Ambulatory Visit | Attending: Internal Medicine | Admitting: Internal Medicine

## 2024-06-05 ENCOUNTER — Other Ambulatory Visit (HOSPITAL_BASED_OUTPATIENT_CLINIC_OR_DEPARTMENT_OTHER): Payer: Self-pay | Admitting: Internal Medicine

## 2024-06-05 DIAGNOSIS — J069 Acute upper respiratory infection, unspecified: Secondary | ICD-10-CM

## 2024-06-05 DIAGNOSIS — R0602 Shortness of breath: Secondary | ICD-10-CM

## 2024-06-05 DIAGNOSIS — R059 Cough, unspecified: Secondary | ICD-10-CM
